# Patient Record
Sex: Male | Born: 2000
Health system: Southern US, Community
[De-identification: ages and names within clinical notes are randomized; demographics above are authoritative.]

## PROBLEM LIST (undated history)

## (undated) DIAGNOSIS — K760 Fatty (change of) liver, not elsewhere classified: Secondary | ICD-10-CM

## (undated) DIAGNOSIS — E669 Obesity, unspecified: Secondary | ICD-10-CM

## (undated) DIAGNOSIS — F4322 Adjustment disorder with anxiety: Secondary | ICD-10-CM

## (undated) DIAGNOSIS — K219 Gastro-esophageal reflux disease without esophagitis: Secondary | ICD-10-CM

## (undated) DIAGNOSIS — Z68.41 Body mass index (BMI) pediatric, greater than or equal to 95th percentile for age: Secondary | ICD-10-CM

## (undated) HISTORY — DX: Gastro-esophageal reflux disease without esophagitis: K21.9

## (undated) HISTORY — DX: Fatty (change of) liver, not elsewhere classified: K76.0

## (undated) HISTORY — DX: Adjustment disorder with anxiety: F43.22

## (undated) HISTORY — DX: Body mass index (bmi) pediatric, greater than or equal to 95th percentile for age: Z68.54

## (undated) HISTORY — DX: Obesity, unspecified: E66.9

---
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
## 2020-04-04 ENCOUNTER — Ambulatory Visit: Payer: BC Managed Care – PPO | Admitting: Family Medicine

## 2020-11-24 ENCOUNTER — Ambulatory Visit (INDEPENDENT_AMBULATORY_CARE_PROVIDER_SITE_OTHER): Payer: BC Managed Care – PPO | Admitting: Family Medicine

## 2020-11-24 ENCOUNTER — Encounter: Payer: Self-pay | Admitting: Family Medicine

## 2020-11-24 ENCOUNTER — Other Ambulatory Visit: Payer: Self-pay

## 2020-11-24 VITALS — BP 166/111 | HR 105 | Temp 97.9°F | Ht 69.0 in | Wt 254.0 lb

## 2020-11-24 DIAGNOSIS — I10 Essential (primary) hypertension: Secondary | ICD-10-CM | POA: Diagnosis not present

## 2020-11-24 DIAGNOSIS — R300 Dysuria: Secondary | ICD-10-CM

## 2020-11-24 DIAGNOSIS — Z13220 Encounter for screening for lipoid disorders: Secondary | ICD-10-CM | POA: Diagnosis not present

## 2020-11-24 DIAGNOSIS — Z131 Encounter for screening for diabetes mellitus: Secondary | ICD-10-CM

## 2020-11-24 DIAGNOSIS — Z0000 Encounter for general adult medical examination without abnormal findings: Secondary | ICD-10-CM | POA: Diagnosis not present

## 2020-11-24 LAB — POCT URINALYSIS DIPSTICK
Bilirubin, UA: NEGATIVE
Blood, UA: NEGATIVE
Glucose, UA: NEGATIVE
Ketones, UA: NEGATIVE
Leukocytes, UA: NEGATIVE
Nitrite, UA: NEGATIVE
Protein, UA: POSITIVE — AB
Spec Grav, UA: 1.015 (ref 1.010–1.025)
Urobilinogen, UA: 0.2 E.U./dL
pH, UA: 7 (ref 5.0–8.0)

## 2020-11-24 LAB — COMPREHENSIVE METABOLIC PANEL
ALT: 60 U/L — ABNORMAL HIGH (ref 0–53)
AST: 33 U/L (ref 0–37)
Albumin: 4.8 g/dL (ref 3.5–5.2)
Alkaline Phosphatase: 63 U/L (ref 39–117)
BUN: 12 mg/dL (ref 6–23)
CO2: 27 mEq/L (ref 19–32)
Calcium: 10.1 mg/dL (ref 8.4–10.5)
Chloride: 103 mEq/L (ref 96–112)
Creatinine, Ser: 0.92 mg/dL (ref 0.40–1.50)
GFR: 119.22 mL/min (ref >60.00)
Glucose, Bld: 99 mg/dL (ref 70–99)
Potassium: 3.6 mEq/L (ref 3.5–5.1)
Sodium: 140 mEq/L (ref 135–145)
Total Bilirubin: 1 mg/dL (ref 0.2–1.2)
Total Protein: 7.4 g/dL (ref 6.0–8.3)

## 2020-11-24 LAB — CBC WITH DIFFERENTIAL/PLATELET
Basophils Absolute: 0.1 10*3/uL (ref 0.0–0.1)
Basophils Relative: 0.8 % (ref 0.0–3.0)
Eosinophils Absolute: 0.2 10*3/uL (ref 0.0–0.7)
Eosinophils Relative: 3 % (ref 0.0–5.0)
HCT: 45.3 % (ref 39.0–52.0)
Hemoglobin: 14.9 g/dL (ref 13.0–17.0)
Lymphocytes Relative: 22.9 % (ref 12.0–46.0)
Lymphs Abs: 1.8 10*3/uL (ref 0.7–4.0)
MCHC: 33 g/dL (ref 30.0–36.0)
MCV: 78.3 fl (ref 78.0–100.0)
Monocytes Absolute: 0.5 10*3/uL (ref 0.1–1.0)
Monocytes Relative: 6 % (ref 3.0–12.0)
Neutro Abs: 5.4 10*3/uL (ref 1.4–7.7)
Neutrophils Relative %: 67.3 % (ref 43.0–77.0)
Platelets: 314 10*3/uL (ref 150.0–400.0)
RBC: 5.79 Mil/uL (ref 4.22–5.81)
RDW: 14.1 % (ref 11.5–15.5)
WBC: 8 10*3/uL (ref 4.0–10.5)

## 2020-11-24 LAB — LIPID PANEL
Cholesterol: 136 mg/dL (ref 0–200)
HDL: 36.8 mg/dL — ABNORMAL LOW (ref >39.00)
LDL Cholesterol: 69 mg/dL (ref 0–99)
NonHDL: 99.04
Total CHOL/HDL Ratio: 4
Triglycerides: 149 mg/dL (ref 0.0–149.0)
VLDL: 29.8 mg/dL (ref 0.0–40.0)

## 2020-11-24 LAB — TSH: TSH: 1.78 u[IU]/mL (ref 0.35–5.50)

## 2020-11-24 NOTE — Progress Notes (Signed)
Office Note 11/24/2020  CC: CPE   HPI:  Patient is a 20 y.o. male who is here for annual health maintenance exam. Feeling well.  No formal exercise.  Trying to more healthy foods, smaller portions. Still doing some on-line college, living with his parents in [place]. He is still very apprehensive in MD offices, hx of white coat syndrome-->says once again that regular home bp monitoring always <130/80.  He is still struggling with the covid pandemic fears, does not feel comfortable getting out of his home, wears mask everywhere.  C/o burning with urination pretty frequently, onset about 1 wk ago, seems signif improved last 2 d. Not clear if any urinary urgency or frequency.  No change in intake of colas/caffeine/sugar drinks prior to onset of sx's.  Has tried to substitute water much more since burning started. No abnormal urine appearance or odor.  No fever, no n/v, no flank pain.  Past Medical History:  Diagnosis Date   Adjustment disorder with anxiety 2016   Childhood obesity, BMI 95-100 percentile 2016   Fatty liver 09/2015   noted on u/s   GERD (gastroesophageal reflux disease)    some dyspepsia as well    History reviewed. No pertinent surgical history.  Family History  Problem Relation Age of Onset   Hypertension Mother    Hypertension Maternal Grandfather    Kidney disease Maternal Grandfather 73       Bright's Disease, [place] now    Social History   Socioeconomic History   Marital status: Single    Spouse name: Not on file   Number of children: Not on file   Years of education: Not on file   Highest education level: Not on file  Occupational History   Not on file  Tobacco Use   Smoking status: Never   Smokeless tobacco: Never  Substance and Sexual Activity   Alcohol use: No   Drug use: No   Sexual activity: Not on file  Other Topics Concern   Not on file  Social History Narrative   Not on file   Social Determinants of Health   Financial  Resource Strain: Not on file  Food Insecurity: Not on file  Transportation Needs: Not on file  Physical Activity: Not on file  Stress: Not on file  Social Connections: Not on file  Intimate Partner Violence: Not on file    Outpatient Medications Prior to Visit  Medication Sig Dispense Refill   Ginger, Zingiber officinalis, (GINGER ROOT PO) Take 500 mg by mouth daily.     OMEPRAZOLE PO Take 20 mg by mouth as needed.     ibuprofen (ADVIL,MOTRIN) 600 MG tablet Take 600 mg by mouth daily as needed. (Patient not taking: Reported on 11/24/2020)     No facility-administered medications prior to visit.    Allergies  Allergen Reactions   Red Dye    ROS Review of Systems  Constitutional:  Negative for appetite change, chills, fatigue and fever.  HENT:  Negative for congestion, dental problem, ear pain and sore throat.   Eyes:  Negative for discharge, redness and visual disturbance.  Respiratory:  Negative for cough, chest tightness, shortness of breath and wheezing.   Cardiovascular:  Negative for chest pain, palpitations and leg swelling.  Gastrointestinal:  Negative for abdominal pain, blood in stool, diarrhea, nausea and vomiting.  Genitourinary:  Negative for difficulty urinating, dysuria, flank pain, frequency, hematuria and urgency.  Musculoskeletal:  Negative for arthralgias, back pain, joint swelling, myalgias and neck stiffness.  Skin:  Negative for pallor and rash.  Neurological:  Negative for dizziness, speech difficulty, weakness and headaches.  Hematological:  Negative for adenopathy. Does not bruise/bleed easily.  Psychiatric/Behavioral:  Negative for confusion and sleep disturbance. The patient is not nervous/anxious.    PE; Vitals with BMI 11/24/2020 08/29/2016 04/23/2016  Height 5\' 9"  5\' 9"  -  Weight 254 lbs 249 lbs 4 oz -  BMI 37.49 36.9 -  Systolic 166 144  Diastolic 111 80 92  Pulse 105 83 -   Gen: Alert, well appearing.  Patient is oriented to person, place,  time, and situation. AFFECT: pleasant, lucid thought and speech. ENT: Ears: EACs clear, normal epithelium.  TMs with good light reflex and landmarks bilaterally.  Eyes: no injection, icteris, swelling, or exudate.  EOMI, PERRLA. Pt did not feel comfortable taking his mask down for me to examine mouth/throat. Neck: supple/nontender.  No LAD, mass, or TM.  Carotid pulses 2+ bilaterally, without bruits. CV: RRR, no m/r/g.   LUNGS: CTA bilat, nonlabored resps, good aeration in all lung fields. ABD: soft, NT, ND, BS normal.  No hepatospenomegaly or mass.  No bruits. EXT: no clubbing, cyanosis, or edema.  Musculoskeletal: no joint swelling, erythema, warmth, or tenderness.  ROM of all joints intact. Skin - no sores or suspicious lesions or rashes or color changes  Pertinent labs:   POC CC dipstick UA today: normal.  ASSESSMENT AND PLAN:   1) Dysuria: normal UA.  Resolving with increased hydration/water substitute for high fructose drinks. Reassured. Signs/symptoms to call or return for were reviewed and pt expressed understanding.  2) White coat HTN: he'll continue periodic monitoring in home and call or return if elevated. Checking cmet, tsh, and cbc today.  3) Health maintenance exam: Reviewed age and gender appropriate health maintenance issues (prudent diet, regular exercise, health risks of tobacco and excessive alcohol, use of seatbelts, fire alarms in home, use of sunscreen).  Also reviewed age and gender appropriate health screening as well as vaccine recommendations. Vaccines: he declined flu today. Labs: health panel-->screening for HLD and DM, obesity, elev bp.  An After Visit Summary was printed and given to the patient.  FOLLOW UP:  No follow-ups on file.  Signed:  , MD           11/24/2020

## 2020-11-24 NOTE — Patient Instructions (Signed)
Health Maintenance, Male Adopting a healthy lifestyle and getting preventive care are important in promoting health and wellness. Ask your health care provider about: The right schedule for you to have regular tests and exams. Things you can do on your own to prevent diseases and keep yourself healthy. What should I know about diet, weight, and exercise? Eat a healthy diet  Eat a diet that includes plenty of vegetables, fruits, low-fat dairy products, and lean protein. Do not eat a lot of foods that are high in solid fats, added sugars, or sodium. Maintain a healthy weight Body mass index (BMI) is a measurement that can be used to identify possible weight problems. It estimates body fat based on height and weight. Your health care provider can help determine your BMI and help you achieve or maintain a healthy weight. Get regular exercise Get regular exercise. This is one of the most important things you can do for your health. Most adults should: Exercise for at least 150 minutes each week. The exercise should increase your heart rate and make you sweat (moderate-intensity exercise). Do strengthening exercises at least twice a week. This is in addition to the moderate-intensity exercise. Spend less time sitting. Even light physical activity can be beneficial. Watch cholesterol and blood lipids Have your blood tested for lipids and cholesterol at 20 years of age, then have this test every 5 years. You may need to have your cholesterol levels checked more often if: Your lipid or cholesterol levels are high. You are older than 21 years of age. You are at high risk for heart disease. What should I know about cancer screening? Many types of cancers can be detected early and may often be prevented. Depending on your health history and family history, you may need to have cancer screening at various ages. This may include screening for: Colorectal cancer. Prostate cancer. Skin cancer. Lung  cancer. What should I know about heart disease, diabetes, and high blood pressure? Blood pressure and heart disease High blood pressure causes heart disease and increases the risk of stroke. This is more likely to develop in people who have high blood pressure readings, are of African descent, or are overweight. Talk with your health care provider about your target blood pressure readings. Have your blood pressure checked: Every 3-5 years if you are 18-39 years of age. Every year if you are 40 years old or older. If you are between the ages of 65 and 75 and are a current or former smoker, ask your health care provider if you should have a one-time screening for abdominal aortic aneurysm (AAA). Diabetes Have regular diabetes screenings. This checks your fasting blood sugar level. Have the screening done: Once every three years after age 45 if you are at a normal weight and have a low risk for diabetes. More often and at a younger age if you are overweight or have a high risk for diabetes. What should I know about preventing infection? Hepatitis B If you have a higher risk for hepatitis B, you should be screened for this virus. Talk with your health care provider to find out if you are at risk for hepatitis B infection. Hepatitis C Blood testing is recommended for: Everyone born from 1945 through 1965. Anyone with known risk factors for hepatitis C. Sexually transmitted infections (STIs) You should be screened each year for STIs, including gonorrhea and chlamydia, if: You are sexually active and are younger than 21 years of age. You are older than 21 years   of age and your health care provider tells you that you are at risk for this type of infection. Your sexual activity has changed since you were last screened, and you are at increased risk for chlamydia or gonorrhea. Ask your health care provider if you are at risk. Ask your health care provider about whether you are at high risk for HIV.  Your health care provider may recommend a prescription medicine to help prevent HIV infection. If you choose to take medicine to prevent HIV, you should first get tested for HIV. You should then be tested every 3 months for as long as you are taking the medicine. Follow these instructions at home: Lifestyle Do not use any products that contain nicotine or tobacco, such as cigarettes, e-cigarettes, and chewing tobacco. If you need help quitting, ask your health care provider. Do not use street drugs. Do not share needles. Ask your health care provider for help if you need support or information about quitting drugs. Alcohol use Do not drink alcohol if your health care provider tells you not to drink. If you drink alcohol: Limit how much you have to 0-2 drinks a day. Be aware of how much alcohol is in your drink. In the [place] one drink equals one 12 oz bottle of beer (355 mL), one 5 oz glass of wine (148 mL), or one 1 oz glass of hard liquor (44 mL). General instructions Schedule regular health, dental, and eye exams. Stay current with your vaccines. Tell your health care provider if: You often feel depressed. You have ever been abused or do not feel safe at home. Summary Adopting a healthy lifestyle and getting preventive care are important in promoting health and wellness. Follow your health care provider's instructions about healthy diet, exercising, and getting tested or screened for diseases. Follow your health care provider's instructions on monitoring your cholesterol and blood pressure. This information is not intended to replace advice given to you by your health care provider. Make sure you discuss any questions you have with your health care provider. Document Revised: 04/08/2020 Document Reviewed: 01/22/2018 Elsevier Patient Education  2022 Elsevier Inc.  

## 2020-11-25 LAB — URINE CULTURE
MICRO NUMBER:: 12499608
Result:: NO GROWTH
SPECIMEN QUALITY:: ADEQUATE

## 2023-01-28 ENCOUNTER — Ambulatory Visit (INDEPENDENT_AMBULATORY_CARE_PROVIDER_SITE_OTHER): Payer: 59 | Admitting: Family Medicine

## 2023-01-28 ENCOUNTER — Encounter: Payer: Self-pay | Admitting: Family Medicine

## 2023-01-28 VITALS — BP 162/110 | HR 119 | Ht 69.0 in | Wt 235.2 lb

## 2023-01-28 DIAGNOSIS — M79601 Pain in right arm: Secondary | ICD-10-CM

## 2023-01-28 DIAGNOSIS — R209 Unspecified disturbances of skin sensation: Secondary | ICD-10-CM

## 2023-01-28 DIAGNOSIS — M791 Myalgia, unspecified site: Secondary | ICD-10-CM

## 2023-01-28 DIAGNOSIS — Z0001 Encounter for general adult medical examination with abnormal findings: Secondary | ICD-10-CM

## 2023-01-28 DIAGNOSIS — M79602 Pain in left arm: Secondary | ICD-10-CM

## 2023-01-28 DIAGNOSIS — R030 Elevated blood-pressure reading, without diagnosis of hypertension: Secondary | ICD-10-CM

## 2023-01-28 DIAGNOSIS — Z0000 Encounter for general adult medical examination without abnormal findings: Secondary | ICD-10-CM

## 2023-01-28 DIAGNOSIS — Z23 Encounter for immunization: Secondary | ICD-10-CM | POA: Diagnosis not present

## 2023-01-28 NOTE — Progress Notes (Signed)
Office Note 01/28/2023  CC:  Chief Complaint  Patient presents with   Annual Exam    HPI:  Patient is a 22 y.o. male who is here accompanied by his mom for annual health maintenance exam.  He has a history of whitecoat syndrome. Blood pressure monitoring at home is consistently less than 130/80.  Has had a few years history of some symptoms in his arms that he finds hard to describe.  He uses the word tightness but then also says hurt.  He describes this sensation occurring mostly in his hands but then says it starts sometimes between his shoulder blades and radiates down both arms.  He says his hands feel cold on and off.  They do not turn pale or blue.  Past Medical History:  Diagnosis Date   Adjustment disorder with anxiety 2016   Childhood obesity, BMI 95-100 percentile 2016   Fatty liver 09/2015   noted on u/s   GERD (gastroesophageal reflux disease)    some dyspepsia as well    History reviewed. No pertinent surgical history.  Family History  Problem Relation Age of Onset   Hypertension Mother    Hypertension Maternal Grandfather    Kidney disease Maternal Grandfather 27       Bright's Disease, [place] now    Social History   Socioeconomic History   Marital status: Single    Spouse name: Not on file   Number of children: Not on file   Years of education: Not on file   Highest education level: Not on file  Occupational History   Not on file  Tobacco Use   Smoking status: Never   Smokeless tobacco: Never  Substance and Sexual Activity   Alcohol use: No   Drug use: No   Sexual activity: Not on file  Other Topics Concern   Not on file  Social History Narrative   Not on file   Social Drivers of Health   Financial Resource Strain: Patient Declined (01/28/2023)   Overall Financial Resource Strain (CARDIA)    Difficulty of Paying Living Expenses: Patient declined  Food Insecurity: Patient Declined (01/28/2023)   Hunger Vital Sign    Worried About  Running Out of Food in the Last Year: Patient declined    Ran Out of Food in the Last Year: Patient declined  Transportation Needs: Patient Declined (01/28/2023)   PRAPARE - Administrator, Civil Service (Medical): Patient declined    Lack of Transportation (Non-Medical): Patient declined  Physical Activity: Unknown (01/28/2023)   Exercise Vital Sign    Days of Exercise per Week: Patient declined    Minutes of Exercise per Session: Not on file  Stress: Patient Declined (01/28/2023)   [employer] of Occupational Health - Occupational Stress Questionnaire    Feeling of Stress : Patient declined  Social Connections: Unknown (01/28/2023)   Social Connection and Isolation Panel [NHANES]    Frequency of Communication with Friends and Family: Patient declined    Frequency of Social Gatherings with Friends and Family: Patient declined    Attends Religious Services: Patient declined    Database administrator or Organizations: Patient declined    Attends Engineer, structural: Not on file    Marital Status: Never married  Intimate Partner Violence: Not on file    Outpatient Medications Prior to Visit  Medication Sig Dispense Refill   OMEPRAZOLE PO Take 20 mg by mouth as needed.     Ginger, Zingiber officinalis, (GINGER ROOT PO)  Take 500 mg by mouth daily.     ibuprofen (ADVIL,MOTRIN) 600 MG tablet Take 600 mg by mouth daily as needed. (Patient not taking: Reported on 01/28/2023)     No facility-administered medications prior to visit.    Allergies  Allergen Reactions   Red Dye #40 (Allura Red)     Review of Systems  Constitutional:  Negative for appetite change, chills, fatigue and fever.  HENT:  Negative for congestion, dental problem, ear pain and sore throat.   Eyes:  Negative for discharge, redness and visual disturbance.  Respiratory:  Negative for cough, chest tightness, shortness of breath and wheezing.   Cardiovascular:  Negative for chest pain,  palpitations and leg swelling.  Gastrointestinal:  Negative for abdominal pain, blood in stool, diarrhea, nausea and vomiting.  Genitourinary:  Negative for difficulty urinating, dysuria, flank pain, frequency, hematuria and urgency.  Musculoskeletal:  Positive for myalgias (arms). Negative for back pain, joint swelling and neck stiffness.  Skin:  Negative for pallor and rash.  Neurological:  Negative for dizziness, speech difficulty, weakness and headaches.  Hematological:  Negative for adenopathy. Does not bruise/bleed easily.  Psychiatric/Behavioral:  Negative for confusion and sleep disturbance. The patient is not nervous/anxious.     PE;    01/28/2023    3:00 PM 11/24/2020    1:11 PM 08/29/2016    1:23 PM  Vitals with BMI  Height 5\' 9"  5\' 9"  5\' 9"   Weight 235 lbs 3 oz 254 lbs 249 lbs 4 oz  BMI 34.72 37.49 36.9  Systolic 162 166 161  Diastolic 110 111 80  Pulse 119 105 83     Gen: Alert, well appearing.  Patient is oriented to person, place, time, and situation. AFFECT: pleasant, lucid thought and speech. ENT: Ears: EACs clear, normal epithelium.  TMs with good light reflex and landmarks bilaterally.  Eyes: no injection, icteris, swelling, or exudate.  EOMI, PERRLA. Nose: no drainage or turbinate edema/swelling.  No injection or focal lesion.  Mouth: lips without lesion/swelling.  Oral mucosa pink and moist.  Dentition intact and without obvious caries or gingival swelling.  Oropharynx without erythema, exudate, or swelling.  Neck: supple/nontender.  No LAD, mass, or TM.  Carotid pulses 2+ bilaterally, without bruits. CV: RRR, no m/r/g.   LUNGS: CTA bilat, nonlabored resps, good aeration in all lung fields. ABD: soft, NT, ND, BS normal.  No hepatospenomegaly or mass.  No bruits. EXT: no clubbing, cyanosis, or edema.  Musculoskeletal: no joint swelling, erythema, warmth, or tenderness.  ROM of all joints intact. Normal sensation in the hands and arms.  He has no atrophy of the  hands.  Hands without cyanosis or erythema. Skin - no sores or suspicious lesions or rashes or color changes  Pertinent labs:  Lab Results  Component Value Date   TSH 1.78 11/24/2020   Lab Results  Component Value Date   WBC 8.0 11/24/2020   HGB 14.9 11/24/2020   HCT 45.3 11/24/2020   MCV 78.3 11/24/2020   PLT 314.0 11/24/2020   Lab Results  Component Value Date   CREATININE 0.92 11/24/2020   BUN 12 11/24/2020   NA 140 11/24/2020   K 3.6 11/24/2020   CL 103 11/24/2020   CO2 27 11/24/2020   Lab Results  Component Value Date   ALT 60 (H) 11/24/2020   AST 33 11/24/2020   ALKPHOS 63 11/24/2020   BILITOT 1.0 11/24/2020   Lab Results  Component Value Date   CHOL 136 11/24/2020  Lab Results  Component Value Date   HDL 36.80 (L) 11/24/2020   Lab Results  Component Value Date   LDLCALC 69 11/24/2020   Lab Results  Component Value Date   TRIG 149.0 11/24/2020   Lab Results  Component Value Date   CHOLHDL 4 11/24/2020   ASSESSMENT AND PLAN:   #1 health maintenance exam: Reviewed age and gender appropriate health maintenance issues (prudent diet, regular exercise, health risks of tobacco and excessive alcohol, use of seatbelts, fire alarms in home, use of sunscreen).  Also reviewed age and gender appropriate health screening as well as vaccine recommendations. Vaccines: Tdap->given today.  Flu->given today.  HPV-->declined. Labs: CBC, TSH, c-Met, lipid panel--> he is fasting today. He has done well with some dietary modification and has lost 20 pounds over the last 2 years.  #2 bilateral arm pain and paresthesias. He has a hard time describing his symptoms but says they are affecting his quality of life.  Sometimes they inhibit sleeping. Exam normal. Check sed rate and CPK. He is in favor of referral to rheumatology.  #3 elevated blood pressure.  History of whitecoat syndrome. Continue periodic home blood pressure monitoring for verification.  An After Visit  Summary was printed and given to the patient.  FOLLOW UP:  Return in about 1 year (around 01/28/2024) for annual CPE (fasting).  Signed:  Santiago Bumpers, MD           01/28/2023

## 2023-01-29 LAB — TSH: TSH: 0.94 u[IU]/mL (ref 0.35–5.50)

## 2023-01-29 LAB — CBC WITH DIFFERENTIAL/PLATELET
Basophils Absolute: 0 10*3/uL (ref 0.0–0.1)
Basophils Relative: 0.8 % (ref 0.0–3.0)
Eosinophils Absolute: 0.2 10*3/uL (ref 0.0–0.7)
Eosinophils Relative: 2.7 % (ref 0.0–5.0)
HCT: 46.3 % (ref 39.0–52.0)
Hemoglobin: 15.1 g/dL (ref 13.0–17.0)
Lymphocytes Relative: 21.7 % (ref 12.0–46.0)
Lymphs Abs: 1.3 10*3/uL (ref 0.7–4.0)
MCHC: 32.7 g/dL (ref 30.0–36.0)
MCV: 80.6 fL (ref 78.0–100.0)
Monocytes Absolute: 0.4 10*3/uL (ref 0.1–1.0)
Monocytes Relative: 7.6 % (ref 3.0–12.0)
Neutro Abs: 3.9 10*3/uL (ref 1.4–7.7)
Neutrophils Relative %: 67.2 % (ref 43.0–77.0)
Platelets: 302 10*3/uL (ref 150.0–400.0)
RBC: 5.74 Mil/uL (ref 4.22–5.81)
RDW: 14.7 % (ref 11.5–15.5)
WBC: 5.9 10*3/uL (ref 4.0–10.5)

## 2023-01-29 LAB — COMPREHENSIVE METABOLIC PANEL
ALT: 37 U/L (ref 0–53)
AST: 25 U/L (ref 0–37)
Albumin: 4.8 g/dL (ref 3.5–5.2)
Alkaline Phosphatase: 58 U/L (ref 39–117)
BUN: 12 mg/dL (ref 6–23)
CO2: 26 meq/L (ref 19–32)
Calcium: 10 mg/dL (ref 8.4–10.5)
Chloride: 105 meq/L (ref 96–112)
Creatinine, Ser: 0.96 mg/dL (ref 0.40–1.50)
GFR: 111.56 mL/min (ref >60.00)
Glucose, Bld: 103 mg/dL — ABNORMAL HIGH (ref 70–99)
Potassium: 4.1 meq/L (ref 3.5–5.1)
Sodium: 140 meq/L (ref 135–145)
Total Bilirubin: 0.8 mg/dL (ref 0.2–1.2)
Total Protein: 7.2 g/dL (ref 6.0–8.3)

## 2023-01-29 LAB — LIPID PANEL
Cholesterol: 123 mg/dL (ref 0–200)
HDL: 35.7 mg/dL — ABNORMAL LOW (ref >39.00)
LDL Cholesterol: 65 mg/dL (ref 0–99)
NonHDL: 87.38
Total CHOL/HDL Ratio: 3
Triglycerides: 114 mg/dL (ref 0.0–149.0)
VLDL: 22.8 mg/dL (ref 0.0–40.0)

## 2023-01-29 LAB — SEDIMENTATION RATE: Sed Rate: 2 mm/h (ref 0–15)

## 2023-01-29 LAB — CK: Total CK: 122 U/L (ref 44–196)

## 2023-01-30 ENCOUNTER — Other Ambulatory Visit (INDEPENDENT_AMBULATORY_CARE_PROVIDER_SITE_OTHER): Payer: 59

## 2023-01-30 DIAGNOSIS — R7309 Other abnormal glucose: Secondary | ICD-10-CM

## 2023-01-30 LAB — HEMOGLOBIN A1C: Hgb A1c MFr Bld: 5.1 % (ref 4.6–6.5)

## 2023-04-24 ENCOUNTER — Telehealth: Payer: Self-pay

## 2023-04-24 DIAGNOSIS — M79601 Pain in right arm: Secondary | ICD-10-CM

## 2023-04-24 NOTE — Telephone Encounter (Signed)
 Copied from CRM [id number]. Topic: Referral - Status >> Apr 24, 2023  1:06 PM Isabell A wrote: Reason for CRM: Patient states at his last visit he discussed getting a referral to a neurologist - patient is calling to follow on up on this.

## 2023-04-25 NOTE — Telephone Encounter (Signed)
 Please confirm, pt thought he was being referred to a rheumatologist Azucena Fallen, PA-C)

## 2023-04-25 NOTE — Addendum Note (Signed)
 Addended by: Jeoffrey Massed on: 04/25/2023 01:08 PM   Modules accepted: Orders

## 2023-04-25 NOTE — Telephone Encounter (Signed)
 Ok, neurology referral ordered

## 2023-04-29 NOTE — Telephone Encounter (Signed)
MyChart message sent and read by pt.

## 2023-05-15 ENCOUNTER — Encounter: Payer: Self-pay | Admitting: Family Medicine

## 2023-05-15 DIAGNOSIS — M79601 Pain in right arm: Secondary | ICD-10-CM

## 2023-05-15 NOTE — Telephone Encounter (Signed)
 The neurology office has tried contacting him multiple times to set this up. They state that they tried calling patient on 3/18, 3/25, and 4/1 and that there was no mailbox set up. Please help him set the appointment up.

## 2023-05-16 NOTE — Telephone Encounter (Signed)
 Pls order referral to [place] neurolog[place] office [place].

## 2023-05-17 ENCOUNTER — Encounter: Payer: Self-pay | Admitting: Neurology

## 2023-07-02 ENCOUNTER — Encounter: Payer: Self-pay | Admitting: Neurology

## 2023-07-02 ENCOUNTER — Ambulatory Visit (INDEPENDENT_AMBULATORY_CARE_PROVIDER_SITE_OTHER): Admitting: Neurology

## 2023-07-02 VITALS — BP 152/83 | HR 113 | Ht 71.0 in | Wt 224.0 lb

## 2023-07-02 DIAGNOSIS — M79602 Pain in left arm: Secondary | ICD-10-CM | POA: Diagnosis not present

## 2023-07-02 DIAGNOSIS — M79601 Pain in right arm: Secondary | ICD-10-CM | POA: Diagnosis not present

## 2023-07-02 NOTE — Progress Notes (Signed)
 [facility] Clinic Note - Initial Visit   Date: 07/02/2023   Aaron Lawrence MRN: [id number] DOB: 12-06-2000   Dear Dr. Johnette Naegeli:  Thank you for your kind referral of Aaron Lawrence for consultation of bilateral arm pain. Although his history is well known to you, please allow us  to reiterate it for the purpose of our medical record. The patient was accompanied to the clinic by self.    Aaron Lawrence is a 23 y.o. right-handed male presenting for evaluation of bilateral arm pain.   IMPRESSION/PLAN: Bilateral arm pain, most suggestive of myofacial pain.  He does not have primary neurological symptoms such as numbness, tingling, or weakness.  Neurological exam is normal.  To be complete, he will return for NCS/EMG bilateral arms.  Further recommendations pending results.   ------------------------------------------------------------- History of present illness: Starting around 2022, he began having achy pain involving the back and arms.  Symptoms are constant and worse about twice per week.  There is no specific triggers, such as activity, and rest does not alleviate pain.   Tylenol helps alleviate pain.  He denies weakness or pain with muscle palpation. No associated numbness/tingling.  He does not smoke or drink alcohol.  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  Out-side paper records, electronic medical record, and images have been reviewed where available and summarized as:  Lab Results  Component Value Date   HGBA1C 5.1 01/30/2023   No results found for: "VITAMINB12" Lab Results  Component Value Date   TSH 0.94 01/28/2023   Lab Results  Component Value Date   ESRSEDRATE 2 01/28/2023    Past Medical History:  Diagnosis Date   Adjustment disorder with anxiety 2016   Childhood obesity, BMI 95-100 percentile 2016   Fatty liver 09/2015   noted on u/s   GERD (gastroesophageal reflux disease)    some  dyspepsia as well    History reviewed. No pertinent surgical history.   Medications:  Outpatient Encounter Medications as of 07/02/2023  Medication Sig   acetaminophen (TYLENOL) 325 MG tablet Take 650 mg by mouth every 6 (six) hours as needed.   Multiple Vitamins-Minerals (MULTIVITAMIN WITH MINERALS) tablet Take 1 tablet by mouth daily.   Omega-3 Fatty Acids (FISH OIL) 300 MG CAPS Take by mouth.   Ginger, Zingiber officinalis, (GINGER ROOT PO) Take 500 mg by mouth daily.   OMEPRAZOLE PO Take 20 mg by mouth as needed.   [DISCONTINUED] ibuprofen (ADVIL,MOTRIN) 600 MG tablet Take 600 mg by mouth daily as needed. (Patient not taking: Reported on 11/24/2020)   No facility-administered encounter medications on file as of 07/02/2023.    Allergies:  Allergies  Allergen Reactions   Red Dye #40 (Allura Red)     Family History: Family History  Problem Relation Age of Onset   Hypertension Mother    Hypertension Maternal Grandfather    Kidney disease Maternal Grandfather 16       Bright's Disease, OK now    Social History: Social History   Tobacco Use   Smoking status: Never   Smokeless tobacco: Never  Substance Use Topics   Alcohol use: No   Drug use: No   Social History   Social History Narrative   Are you right handed or left handed? Right Handed    Are you currently employed ? NO    What is your current occupation?   Do you live at home alone? NO    Who lives  with you? Mom, sister, and brother    What type of home do you live in: 1 story or 2 story? Lives in a two story home        Vital Signs:  BP (!) 152/83   Pulse (!) 113   Ht 5\' 11"  (1.803 m)   Wt 224 lb (101.6 kg)   SpO2 100%   BMI 31.24 kg/m    Neurological Exam: MENTAL STATUS including orientation to time, place, person, recent and remote memory, attention span and concentration, language, and fund of knowledge is normal.  Speech is not dysarthric.  CRANIAL NERVES: II:  No visual field defects.      III-IV-VI: Pupils equal round and reactive to light.  Normal conjugate, extra-ocular eye movements in all directions of gaze.  No nystagmus.  No ptosis.   V:  Normal facial sensation.    VII:  Normal facial symmetry and movements.   VIII:  Normal hearing and vestibular function.   IX-X:  Normal palatal movement.   XI:  Normal shoulder shrug and head rotation.   XII:  Normal tongue strength and range of motion, no deviation or fasciculation.  MOTOR:  No atrophy, fasciculations or abnormal movements.  No pronator drift.   Upper Extremity:  Right  Left  Deltoid  5/5   5/5   Biceps  5/5   5/5   Triceps  5/5   5/5   Wrist extensors  5/5   5/5   Wrist flexors  5/5   5/5   Finger extensors  5/5   5/5   Finger flexors  5/5   5/5   Dorsal interossei  5/5   5/5   Abductor pollicis  5/5   5/5   Tone (Ashworth scale)  0  0   Lower Extremity:  Right  Left  Hip flexors  5/5   5/5   Knee flexors  5/5   5/5   Knee extensors  5/5   5/5   Dorsiflexors  5/5   5/5   Plantarflexors  5/5   5/5   Toe extensors  5/5   5/5   Toe flexors  5/5   5/5   Tone (Ashworth scale)  0  0   MSRs:                                           Right        Left brachioradialis 2+  2+  biceps 2+  2+  triceps 2+  2+  patellar 2+  2+  ankle jerk 2+  2+  Hoffman no  no  plantar response down  down   SENSORY:  Normal and symmetric perception of light touch, pinprick, vibration, and temperature.  Romberg's sign absent.   COORDINATION/GAIT: Normal finger-to- nose-finger.  Intact rapid alternating movements bilaterally.  Able to rise from a chair without using arms.  Gait narrow based and stable. Tandem and stressed gait intact.     Thank you for allowing me to participate in patient's care.  If I can answer any additional questions, I would be pleased to do so.    Sincerely,    Wynee Matarazzo K. Lydia Sams, DO

## 2023-07-02 NOTE — Patient Instructions (Signed)
Nerve testing of the arms ° °ELECTROMYOGRAM AND NERVE CONDUCTION STUDIES (EMG/NCS) INSTRUCTIONS ° °How to Prepare °The neurologist conducting the EMG will need to know if you have certain medical conditions. Tell the neurologist and other EMG lab personnel if you: °Have a pacemaker or any other electrical medical device °Take blood-thinning medications °Have hemophilia, a blood-clotting disorder that causes prolonged bleeding °Bathing °Take a shower or bath shortly before your exam in order to remove oils from your skin. Don’t apply lotions or creams before the exam.  °What to Expect °You’ll likely be asked to change into a hospital gown for the procedure and lie down on an examination table. The following explanations can help you understand what will happen during the exam.  °Electrodes. The neurologist or a technician places surface electrodes at various locations on your skin depending on where you’re experiencing symptoms. Or the neurologist may insert needle electrodes at different sites depending on your symptoms.  °Sensations. The electrodes will at times transmit a tiny electrical current that you may feel as a twinge or spasm. The needle electrode may cause discomfort or pain that usually ends shortly after the needle is removed. °If you are concerned about discomfort or pain, you may want to talk to the neurologist about taking a short break during the exam.  °Instructions. During the needle EMG, the neurologist will assess whether there is any spontaneous electrical activity when the muscle is at rest - activity that isn’t present in healthy muscle tissue - and the degree of activity when you slightly contract the muscle.  °He or she will give you instructions on resting and contracting a muscle at appropriate times. Depending on what muscles and nerves the neurologist is examining, he or she may ask you to change positions during the exam.  °After your EMG °You may experience some temporary, minor  bruising where the needle electrode was inserted into your muscle. This bruising should fade within several days. If it persists, contact your primary care doctor.  ° °

## 2023-07-09 ENCOUNTER — Encounter: Payer: Self-pay | Admitting: Family Medicine

## 2023-07-10 NOTE — Telephone Encounter (Signed)
 You do not have to to do the nerve conduction study. Come back to see me and I will reexamine you and we will discuss next steps.

## 2023-07-18 ENCOUNTER — Ambulatory Visit: Admitting: Family Medicine

## 2023-07-29 ENCOUNTER — Encounter: Payer: Self-pay | Admitting: Family Medicine

## 2023-07-29 ENCOUNTER — Ambulatory Visit: Admitting: Family Medicine

## 2023-07-29 VITALS — BP 126/70 | HR 97 | Temp 98.9°F | Ht 71.0 in | Wt 223.2 lb

## 2023-07-29 DIAGNOSIS — M797 Fibromyalgia: Secondary | ICD-10-CM

## 2023-07-29 DIAGNOSIS — Z23 Encounter for immunization: Secondary | ICD-10-CM

## 2023-07-29 DIAGNOSIS — L649 Androgenic alopecia, unspecified: Secondary | ICD-10-CM | POA: Diagnosis not present

## 2023-07-29 MED ORDER — DICLOFENAC SODIUM 75 MG PO TBEC: 75.0000 mg | DELAYED_RELEASE_TABLET | Freq: Two times a day (BID) | ORAL | 0 refills | Status: DC

## 2023-07-29 MED ORDER — FINASTERIDE 1 MG PO TABS: ORAL_TABLET | ORAL | 1 refills | Status: DC

## 2023-07-29 NOTE — Progress Notes (Signed)
 OFFICE VISIT  07/29/2023  CC:  Chief Complaint  Patient presents with   Hand Pain    Bilateral; appt with Dr.Patel 5/20    Patient is a 23 y.o. male who presents for follow-up pain in arms.  INTERIM HX: He saw the neurologist on 07/02/23.  It was felt that his arm pains were most suggestive of myofascial pain.  NCS/EMG were ordered.  Symptom history: For approximately 2 or 3 years he has had trouble with frequent pain in the upper back and shoulders and arms.  Sometimes he will have pain from neck down to feet.  He essentially hurts every day but there are periods when he is worse than others.  He does feel a sense of decreased concentration and brain fog during his periods of worse pain.  He does not have any rash, focal joint pain, joint swelling, fevers, eye redness, or oral ulcers. Appetite is good, no abnormal weight loss. No known alleviating factors other than wearing some close over his arms tends to help some.  He occasionally takes Tylenol and finds it mildly helpful.  Some days he has to rest for a few days and put schoolwork off because he feels too bad.  He does have a concern of hair loss.  He has been putting finasteride drops over the vertex but requests finasteride pills. 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  No polyuria or polydipsia.  No focal weakness, paresthesias, or tremors.  No acute vision or hearing abnormalities.  No dysuria or unusual/new urinary urgency or frequency.  No recent changes in lower legs. No n/v/d or abd pain.  No palpitations.    Past Medical History:  Diagnosis Date   Adjustment disorder with anxiety 2016   Childhood obesity, BMI 95-100 percentile 2016   Fatty liver 09/2015   noted on u/s   GERD (gastroesophageal reflux disease)    some dyspepsia as well    History reviewed. No pertinent surgical  history.  Outpatient Medications Prior to Visit  Medication Sig Dispense Refill   acetaminophen (TYLENOL) 325 MG tablet Take 650 mg by mouth every 6 (six) hours as needed.     Ginger, Zingiber officinalis, (GINGER ROOT PO) Take 500 mg by mouth daily.     Multiple Vitamins-Minerals (MULTIVITAMIN WITH MINERALS) tablet Take 1 tablet by mouth daily.     Omega-3 Fatty Acids (FISH OIL) 300 MG CAPS Take by mouth.     OMEPRAZOLE PO Take 20 mg by mouth as needed.     No facility-administered medications prior to visit.    Allergies  Allergen Reactions   Red Dye #40 (Allura Red)     Review of Systems As per HPI  PE:    07/29/2023    1:02 PM 07/02/2023    2:30 PM 01/28/2023    3:00 PM  Vitals with BMI  Height 5' 11 5' 11 5' 9  Weight 223 lbs 3 oz 224 lbs 235 lbs 3 oz  BMI 31.14 31.26 34.72  Systolic 148 152 956  Diastolic 77 83 110  Pulse 97 113 119   Physical Exam  General: Alert and well-appearing. Affect is pleasant, speech and thought are lucid. Scalp: He has some sparse hair loss around the vertex region.  He does have receding hairline on both sides. OZH:YQMV: no injection, icteris, swelling, or exudate.  EOMI, PERRLA.  Mouth: lips without lesion/swelling.  Oral mucosa pink and moist. Oropharynx without erythema, exudate, or swelling.  CV: RRR, no m/r/g.   LUNGS: CTA bilat, nonlabored resps, good aeration in all lung fields. Extremities: No edema. Skin: No rash.  LABS:  Last CBC Lab Results  Component Value Date   WBC 5.9 01/28/2023   HGB 15.1 01/28/2023   HCT 46.3 01/28/2023   MCV 80.6 01/28/2023   RDW 14.7 01/28/2023   PLT 302.0 01/28/2023   Last metabolic panel Lab Results  Component Value Date   GLUCOSE 103 (H) 01/28/2023   NA 140 01/28/2023   K 4.1 01/28/2023   CL 105 01/28/2023   CO2 26 01/28/2023   BUN 12 01/28/2023   CREATININE 0.96 01/28/2023   GFR 111.56 01/28/2023   CALCIUM 10.0 01/28/2023   PROT 7.2 01/28/2023   ALBUMIN 4.8 01/28/2023    BILITOT 0.8 01/28/2023   ALKPHOS 58 01/28/2023   AST 25 01/28/2023   ALT 37 01/28/2023   Last hemoglobin A1c Lab Results  Component Value Date   HGBA1C 5.1 01/30/2023   Last thyroid functions Lab Results  Component Value Date   TSH 0.94 01/28/2023   Lab Results  Component Value Date   CKTOTAL 122 01/28/2023   Lab Results  Component Value Date   ESRSEDRATE 2 01/28/2023   IMPRESSION AND PLAN:  #1 fibromyalgia suspected. Discussed the benefits of exercise and weight loss. Will do a trial of Voltaren 75 mg twice a day as needed during his periods of flare. He does not want to try anything specifically for fibromyalgia at this point in time (tricyclic's, Cymbalta, gabapentin, Lyrica).  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 He requests finasteride.  I prescribed 1 mg daily today.  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  It has been a decade or so since he has knowingly ingested any. He recalls a rash being the sign of his allergy.  Finasteride and diclofenac both carry some risk of cross allergic reaction. We discussed this in feel like the potential improvement from the medication outweighs the risk.  He will talk this over with his mother as well before trying the medications.  #3 preventative medicine. Meningococcal vaccine #2 today.  FOLLOW UP: Return in about 4 weeks (around 08/26/2023) for f/u fibromyalgia.  Signed:  Arletha Lady, MD           07/29/2023

## 2023-10-03 ENCOUNTER — Encounter: Payer: Self-pay | Admitting: Family Medicine

## 2023-10-03 NOTE — Telephone Encounter (Signed)
 Pt was last seen 6/16, advised to f/u 4 weeks fibromyalgia.  Please fill, if appropriate.

## 2023-10-08 MED ORDER — FINASTERIDE 1 MG PO TABS: ORAL_TABLET | ORAL | 1 refills | Status: AC

## 2023-10-08 NOTE — Telephone Encounter (Signed)
Please see message below regarding refill.

## 2023-10-08 NOTE — Telephone Encounter (Signed)
 Finasteride  rx sent. Virtual visit is ok.

## 2023-10-11 ENCOUNTER — Encounter: Payer: Self-pay | Admitting: Family Medicine

## 2023-10-11 ENCOUNTER — Telehealth (INDEPENDENT_AMBULATORY_CARE_PROVIDER_SITE_OTHER): Admitting: Family Medicine

## 2023-10-11 DIAGNOSIS — M797 Fibromyalgia: Secondary | ICD-10-CM | POA: Diagnosis not present

## 2023-10-11 MED ORDER — GABAPENTIN 100 MG PO CAPS
ORAL_CAPSULE | ORAL | 0 refills | Status: DC
Start: 2023-10-11 — End: 2023-12-13

## 2023-10-11 NOTE — Progress Notes (Signed)
 Virtual Visit via Video Note  I connected with Aaron Lawrence  on 10/11/23 at  4:00 PM EDT by a video enabled telemedicine application and verified that I am speaking with the correct person using two identifiers.  Location patient: [place] Location provider:work or home office Persons participating in the virtual visit: patient, provider  I discussed the limitations and requested verbal permission for telemedicine visit. The patient expressed understanding and agreed to proceed.   HPI: 23 year old male being seen today for 42-month follow-up fibromyalgia. Last visit we started him on a trial of Voltaren  75 mg twice daily as needed for fibromyalgia. He did not want to try anything specifically for fibromyalgia at this point in time (tricyclic's, Cymbalta, gabapentin , Lyrica).  Update: Voltaren  did not help any. He hurts every day, waxing and waning in intensity.  Mostly in the back and arms. Some days feels generalized weakness and mental fog. Denies depressed mood.  ROS: See pertinent positives and negatives per HPI.  Past Medical History:  Diagnosis Date   Adjustment disorder with anxiety 2016   Childhood obesity, BMI 95-100 percentile 2016   Fatty liver 09/2015   noted on u/s   GERD (gastroesophageal reflux disease)    some dyspepsia as well    History reviewed. No pertinent surgical history.   Current Outpatient Medications:    acetaminophen (TYLENOL) 325 MG tablet, Take 650 mg by mouth every 6 (six) hours as needed., Disp: , Rfl:    finasteride  (PROPECIA ) 1 MG tablet, 1 tab po qd, Disp: 90 tablet, Rfl: 1   Ginger, Zingiber officinalis, (GINGER ROOT PO), Take 500 mg by mouth daily., Disp: , Rfl:    Multiple Vitamins-Minerals (MULTIVITAMIN WITH MINERALS) tablet, Take 1 tablet by mouth daily., Disp: , Rfl:    Omega-3 Fatty Acids (FISH OIL) 300 MG CAPS, Take by mouth., Disp: , Rfl:    OMEPRAZOLE PO, Take 20 mg by mouth as needed., Disp: , Rfl:   EXAM:  VITALS per patient if  applicable:     07/29/2023    1:37 PM 07/29/2023    1:02 PM 07/02/2023    2:30 PM  Vitals with BMI  Height  5' 11 5' 11  Weight  223 lbs 3 oz 224 lbs  BMI  31.14 31.26  Systolic 126 148 847  Diastolic 70 77 83  Pulse  97 113     GENERAL: alert, oriented, appears well and in no acute distress  HEENT: atraumatic, conjunttiva clear, no obvious abnormalities on inspection of external nose and ears  NECK: normal movements of the head and neck  LUNGS: on inspection no signs of respiratory distress, breathing rate appears normal, no obvious gross SOB, gasping or wheezing  CV: no obvious cyanosis  MS: moves all visible extremities without noticeable abnormality  PSYCH/NEURO: pleasant and cooperative, no obvious depression or anxiety, speech and thought processing grossly intact  LABS: none today    Chemistry      Component Value Date/Time   NA 140 01/28/2023 1550   K 4.1 01/28/2023 1550   CL 105 01/28/2023 1550   CO2 26 01/28/2023 1550   BUN 12 01/28/2023 1550   CREATININE 0.96 01/28/2023 1550   CREATININE 0.83 04/23/2016 1120      Component Value Date/Time   CALCIUM 10.0 01/28/2023 1550   ALKPHOS 58 01/28/2023 1550   AST 25 01/28/2023 1550   ALT 37 01/28/2023 1550   BILITOT 0.8 01/28/2023 1550     ASSESSMENT AND PLAN:  Discussed the following assessment and  plan:  Fibromyalgia.  No improvement with Voltaren  as needed. He is very uncomfortable with trying anything in the category of antidepressant for this. He is agreeable to a trial of gabapentin .  Will start 100 mg once a day and increase every 5 days by 1 tab a day. Therapeutic expectations and side effect profile of medication discussed today.  Patient's questions answered.   I discussed the assessment and treatment plan with the patient. The patient was provided an opportunity to ask questions and all were answered. The patient agreed with the plan and demonstrated an understanding of the instructions.   F/u:  3-4 wks, either in person or virtual  Signed:  Gerlene Hockey, MD           10/11/2023

## 2023-12-13 ENCOUNTER — Telehealth: Admitting: Family Medicine

## 2023-12-13 ENCOUNTER — Encounter: Payer: Self-pay | Admitting: Family Medicine

## 2023-12-13 VITALS — Wt 218.0 lb

## 2023-12-13 DIAGNOSIS — M797 Fibromyalgia: Secondary | ICD-10-CM

## 2023-12-13 MED ORDER — DULOXETINE HCL 20 MG PO CPEP: 20.0000 mg | ORAL_CAPSULE | Freq: Every day | ORAL | 1 refills | Status: AC

## 2023-12-13 NOTE — Progress Notes (Signed)
 Virtual Visit via Video Note  I connected with Aaron Lawrence  on 12/13/23 at  2:40 PM EDT by a video enabled telemedicine application and verified that I am speaking with the correct person using two identifiers.  Location patient: [place] Location provider:work or home office Persons participating in the virtual visit: patient, provider  I discussed the limitations and requested verbal permission for telemedicine visit. The patient expressed understanding and agreed to proceed.  CC: f/u fibromyalgia A/P as of last visit: Fibromyalgia.  No improvement with Voltaren  as needed. He is very uncomfortable with trying anything in the category of antidepressant for this. He is agreeable to a trial of gabapentin .  Will start 100 mg once a day and increase every 5 days by 1 tab a day.  INTERIM HX: 23 year old male being seen today for 40-month follow-up fibromyalgia. He took the gabapentin  for a while but did not note any improvement.  He got to 100 mg 3 times a day and also tried taking 300 mg once a day. No adverse side effects.  Since the weather is getting colder he does note his body aches are more persistent.  Severity has increased a little bit, affects some of the things he tries to do such as open jars. No rash, no fevers, no joint swelling.   ROS: See pertinent positives and negatives per HPI.  Past Medical History:  Diagnosis Date   Adjustment disorder with anxiety 2016   Childhood obesity, BMI 95-100 percentile 2016   Fatty liver 09/2015   noted on u/s   GERD (gastroesophageal reflux disease)    some dyspepsia as well    History reviewed. No pertinent surgical history.   Current Outpatient Medications:    acetaminophen (TYLENOL) 325 MG tablet, Take 650 mg by mouth every 6 (six) hours as needed., Disp: , Rfl:    DULoxetine (CYMBALTA) 20 MG capsule, Take 1 capsule (20 mg total) by mouth daily., Disp: 30 capsule, Rfl: 1   finasteride  (PROPECIA ) 1 MG tablet, 1 tab po qd, Disp: 90  tablet, Rfl: 1   Ginger, Zingiber officinalis, (GINGER ROOT PO), Take 500 mg by mouth daily., Disp: , Rfl:    Multiple Vitamins-Minerals (MULTIVITAMIN WITH MINERALS) tablet, Take 1 tablet by mouth daily., Disp: , Rfl:    Omega-3 Fatty Acids (FISH OIL) 300 MG CAPS, Take by mouth., Disp: , Rfl:    OMEPRAZOLE PO, Take 20 mg by mouth as needed., Disp: , Rfl:   EXAM:  VITALS per patient if applicable:     12/13/2023    1:42 PM 07/29/2023    1:37 PM 07/29/2023    1:02 PM  Vitals with BMI  Height   5' 11  Weight 218 lbs  223 lbs 3 oz  BMI   31.14  Systolic  126 148  Diastolic  70 77  Pulse   97    GENERAL: alert, oriented, appears well and in no acute distress  HEENT: atraumatic, conjunttiva clear, no obvious abnormalities on inspection of external nose and ears  NECK: normal movements of the head and neck  LUNGS: on inspection no signs of respiratory distress, breathing rate appears normal, no obvious gross SOB, gasping or wheezing  CV: no obvious cyanosis  MS: moves all visible extremities without noticeable abnormality  PSYCH/NEURO: pleasant and cooperative, no obvious depression or anxiety, speech and thought processing grossly intact  LABS: none today Lab Results  Component Value Date   CHOL 123 01/28/2023   HDL 35.70 (L) 01/28/2023   LDLCALC  65 01/28/2023   TRIG 114.0 01/28/2023   CHOLHDL 3 01/28/2023   Lab Results  Component Value Date   HGBA1C 5.1 01/30/2023   Lab Results  Component Value Date   WBC 5.9 01/28/2023   HGB 15.1 01/28/2023   HCT 46.3 01/28/2023   MCV 80.6 01/28/2023   PLT 302.0 01/28/2023   Lab Results  Component Value Date   ESRSEDRATE 2 01/28/2023   ASSESSMENT AND PLAN:  Discussed the following assessment and plan:  Fibromyalgia. No improvement with as needed use of diclofenac  and recent trial of low-dose gabapentin . Will consider retrying gabapentin  in the future and try to maximize the dose more. However, at this time we will do a  trial of Cymbalta 20 mg a day.  I discussed the assessment and treatment plan with the patient. The patient was provided an opportunity to ask questions and all were answered. The patient agreed with the plan and demonstrated an understanding of the instructions.   F/u: 1 month  Signed:  Gerlene Hockey, MD           12/13/2023

## 2024-01-02 ENCOUNTER — Encounter: Payer: Self-pay | Admitting: Family Medicine

## 2024-01-29 ENCOUNTER — Encounter: Payer: 59 | Admitting: Family Medicine

## 2024-02-24 ENCOUNTER — Encounter: Admitting: Family Medicine

## 2024-04-17 ENCOUNTER — Encounter: Admitting: Family Medicine
